# Patient Record
Sex: Female | Born: 1995 | Race: White | Hispanic: No | Marital: Single | Smoking: Never smoker
Health system: Southern US, Community
[De-identification: ages and names within clinical notes are randomized; demographics above are authoritative.]

---

## 2018-11-09 ENCOUNTER — Emergency Department
Admission: EM | Admit: 2018-11-09 | Discharge: 2018-11-09 | Disposition: A | Discharge status: Home / Self Care | Payer: Self-pay | Attending: Student in an Organized Health Care Education/Training Program | Admitting: Student in an Organized Health Care Education/Training Program

## 2018-11-09 ENCOUNTER — Emergency Department: Payer: Self-pay

## 2018-11-09 ENCOUNTER — Encounter: Payer: Self-pay | Admitting: Intensive Care

## 2018-11-09 ENCOUNTER — Other Ambulatory Visit: Payer: Self-pay

## 2018-11-09 DIAGNOSIS — Y92838 Other recreation area as the place of occurrence of the external cause: Secondary | ICD-10-CM | POA: Insufficient documentation

## 2018-11-09 DIAGNOSIS — J029 Acute pharyngitis, unspecified: Secondary | ICD-10-CM | POA: Insufficient documentation

## 2018-11-09 DIAGNOSIS — R110 Nausea: Secondary | ICD-10-CM | POA: Insufficient documentation

## 2018-11-09 DIAGNOSIS — Y9344 Activity, trampolining: Secondary | ICD-10-CM | POA: Insufficient documentation

## 2018-11-09 DIAGNOSIS — S0003XA Contusion of scalp, initial encounter: Secondary | ICD-10-CM | POA: Insufficient documentation

## 2018-11-09 DIAGNOSIS — Y998 Other external cause status: Secondary | ICD-10-CM | POA: Insufficient documentation

## 2018-11-09 DIAGNOSIS — W228XXA Striking against or struck by other objects, initial encounter: Secondary | ICD-10-CM | POA: Insufficient documentation

## 2018-11-09 LAB — INFLUENZA PANEL BY PCR (TYPE A & B)
Influenza A By PCR: NEGATIVE
Influenza B By PCR: NEGATIVE

## 2018-11-09 LAB — GROUP A STREP BY PCR: Group A Strep by PCR: NOT DETECTED

## 2018-11-09 MED ORDER — LIDOCAINE VISCOUS HCL 2 % MT SOLN: 5.0000 mL | Freq: Four times a day (QID) | OROMUCOSAL | 0 refills | Status: AC | PRN

## 2018-11-09 MED ORDER — PROMETHAZINE-DM 6.25-15 MG/5ML PO SYRP: 5.0000 mL | ORAL_SOLUTION | Freq: Four times a day (QID) | ORAL | 0 refills | Status: AC | PRN

## 2018-11-09 NOTE — ED Notes (Signed)
First Nurse Note: Patient states she hurt her head a week ago doing a "back flip", also complaining of nausea since that time.  Alert and oriented.  Indicates back of head as source of pain.

## 2018-11-09 NOTE — Discharge Instructions (Signed)
Your CT was unremarkable.  Your strep and flu test were negative.  Sore throat has a viral etiology.  Follow discharge care instruction take medication as directed.

## 2018-11-09 NOTE — ED Triage Notes (Signed)
Patient c/o sore throat X2 days with nausea, chills/sweats, decreased food intake. Denies urinary symptoms. HX strep. Mom also reports patient was on trampoline Christmas eve and hit head the wrong way and ever since has had slight headache. Pupils reactive and equal in triage. No blurry vision. A&O x4. Ambulatory with no problems

## 2018-11-09 NOTE — ED Provider Notes (Signed)
[facility] Emergency Department Provider Note   ____________________________________________   First MD Initiated Contact with Patient 11/09/18 1018     (approximate)  I have reviewed the triage vital signs and the nursing notes.   HISTORY  Chief Complaint Headache and Sore Throat    HPI Marie Lee is a 22 y.o. female patient complain of sore throat for 2 days.  Patient also complaining of nausea, chills, decreased food intake.  Patient has a history of recurrent strep pharyngitis.  Patient states her family did not take flu shots.  Mother state patient hit the back of her head on a trampoline on Christmas Eve has had continued headaches that is not relieved with Tylenol.  Patient denies blurry vision but states intimating vertigo.  Patient rates her sore throat as 8/10.  Patient described the pain is "scratchy".  History reviewed. No pertinent past medical history.  There are no active problems to display for this patient.   History reviewed. No pertinent surgical history.  Prior to Admission medications   Medication Sig Start Date End Date Taking? Authorizing Provider  lidocaine (XYLOCAINE) 2 % solution Use as directed 5 mLs in the mouth or throat every 6 (six) hours as needed for mouth pain. Mix with 5 mL of Phenergan DM for swish and swallow. 11/09/18   Joni ReiningSmith, Ronald K, PA-C  lidocaine (XYLOCAINE) 2 % solution Use as directed 5 mLs in the mouth or throat every 6 (six) hours as needed for mouth pain. Mix with Phenergan DM for swish and swallow 11/09/18   Joni ReiningSmith, Ronald K, PA-C  promethazine-dextromethorphan (PROMETHAZINE-DM) 6.25-15 MG/5ML syrup Take 5 mLs by mouth 4 (four) times daily as needed for cough. Mix with 5 mL of viscous lidocaine for swish and swallow. 11/09/18   Joni ReiningSmith, Ronald K, PA-C    Allergies Patient has no known allergies.  History reviewed. No pertinent family history.  Social History Social History   Tobacco Use  .  Smoking status: Never Smoker  . Smokeless tobacco: Never Used  Substance Use Topics  . Alcohol use: Yes  . Drug use: Never    Review of Systems Constitutional: Chills/sweats.  Decreased appetite secondary to sore throat. Eyes: No visual changes. ENT: Sore throat. Cardiovascular: Denies chest pain. Respiratory: Denies shortness of breath. Gastrointestinal: No abdominal pain.  No nausea, no vomiting.  No diarrhea.  No constipation. Genitourinary: Negative for dysuria. Musculoskeletal: Negative for back pain. Skin: Negative for rash. Neurological: Positive for headaches, but denies focal weakness or numbness.  Intermittent slight vertigo.   ____________________________________________   PHYSICAL EXAM:  VITAL SIGNS: ED Triage Vitals [11/09/18 0959]  Enc Vitals Group     BP 113/71     Pulse Rate (!) 109     Resp 16     Temp 99.3 F (37.4 C)     Temp Source Oral     SpO2 97 %     Weight 130 lb (59 kg)     Height 5\' 3"  (1.6 m)     Head Circumference      Peak Flow      Pain Score 8     Pain Loc      Pain Edu?      Excl. in GC?    Constitutional: Alert and oriented. Well appearing and in no acute distress. Eyes: Conjunctivae are normal. PERRL. EOMI. Head: Atraumatic.  Tender to palpation occipital area. Nose: No congestion/rhinnorhea. Mouth/Throat: Mucous membranes are moist.  Oropharynx erythematous. Neck: No stridor.  Hematological/Lymphatic/Immunilogical: No cervical lymphadenopathy. Cardiovascular: Normal rate, regular rhythm. Grossly normal heart sounds.  Good peripheral circulation. Respiratory: Normal respiratory effort.  No retractions. Lungs CTAB. Neurologic:  Normal speech and language. No gross focal neurologic deficits are appreciated. No gait instability. Skin:  Skin is warm, dry and intact. No rash noted. Psychiatric: Mood and affect are normal. Speech and behavior are normal.  ____________________________________________   LABS (all labs ordered are  listed, but only abnormal results are displayed)  Labs Reviewed  GROUP A STREP BY PCR  INFLUENZA PANEL BY PCR (TYPE A & B)   ____________________________________________  EKG   ____________________________________________  RADIOLOGY  ED MD interpretation:    Official radiology report(s): Ct Head Wo Contrast  Result Date: 11/09/2018 CLINICAL DATA:  22 year old female with history of occipital injury during a back flip on 11/03/2018. Continued headaches and nausea since that time. Blurred vision and photosensitivity. EXAM: CT HEAD WITHOUT CONTRAST TECHNIQUE: Contiguous axial images were obtained from the base of the skull through the vertex without intravenous contrast. COMPARISON:  None. FINDINGS: Brain: No evidence of acute infarction, hemorrhage, hydrocephalus, extra-axial collection or mass lesion/mass effect. Vascular: No hyperdense vessel or unexpected calcification. Skull: Normal. Negative for fracture or focal lesion. Sinuses/Orbits: No acute finding. Other: None. IMPRESSION: 1. No acute intracranial abnormalities. The appearance of the brain is normal. Electronically Signed   By: Trudie Reedaniel  Entrikin M.D.   On: 11/09/2018 10:56    ____________________________________________   PROCEDURES  Procedure(s) performed: None  Procedures  Critical Care performed: No  ____________________________________________   INITIAL IMPRESSION / ASSESSMENT AND PLAN / ED COURSE  As part of my medical decision making, I reviewed the following data within the electronic MEDICAL RECORD NUMBER    Patient presents with persistent headache secondary to contusion 6 days ago.  Patient also complain of sore throat with body aches intermittent fever and chills.  Discussed negative head CT with patient.  Patient flu and strep test were negative.  Patient diagnosis viral pharyngitis and head contusion.  Patient given discharge care instruction advised take medication as directed.       ____________________________________________   FINAL CLINICAL IMPRESSION(S) / ED DIAGNOSES  Final diagnoses:  Viral pharyngitis  Contusion of scalp, initial encounter     ED Discharge Orders         Ordered    promethazine-dextromethorphan (PROMETHAZINE-DM) 6.25-15 MG/5ML syrup  4 times daily PRN     11/09/18 1144    lidocaine (XYLOCAINE) 2 % solution  Every 6 hours PRN     11/09/18 1144    lidocaine (XYLOCAINE) 2 % solution  Every 6 hours PRN     11/09/18 1147           Note:  This document was prepared using Dragon voice recognition software and may include unintentional dictation errors.    Joni ReiningSmith, Ronald K, PA-C 11/09/18 1518    Willy Eddyobinson, Patrick, MD 11/09/18 [phone]

## 2020-08-19 IMAGING — CT CT HEAD W/O CM
3 series · 16 of 43 positions shown, 19 images · non-contrast
Comparison: None.

CLINICAL DATA: 22-year-old female with history of occipital injury
during a back flip on 11/03/2018. Continued headaches and nausea
since that time. Blurred vision and photosensitivity.

EXAM:
CT HEAD WITHOUT CONTRAST
TECHNIQUE: Contiguous axial images were obtained from the base of the skull
through the vertex without intravenous contrast.

[Series 2: head wo · axial · 0.38mm/px · z∈[-126,-21]mm · 10 of 26 slices shown, 13 images]
[im 3/26  brain]
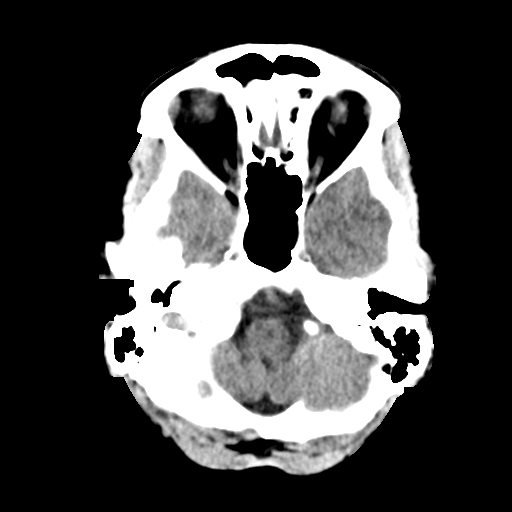
[im 3/26  bone]
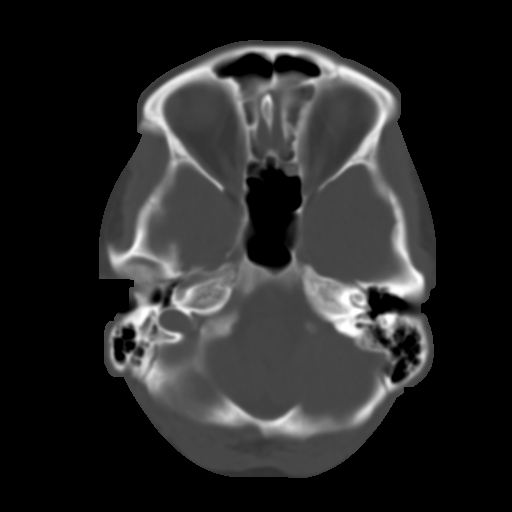
[im 5/26  brain]
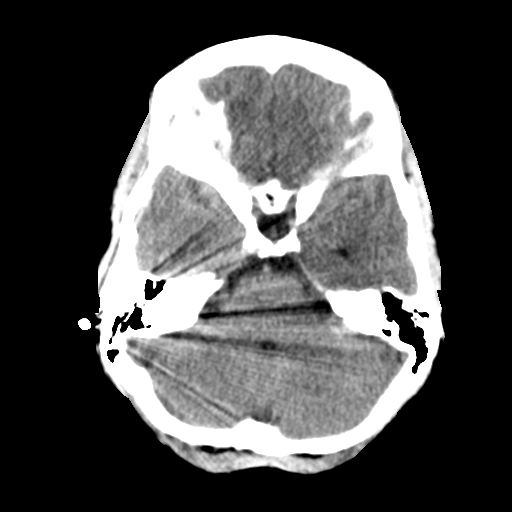
[im 8/26  brain]
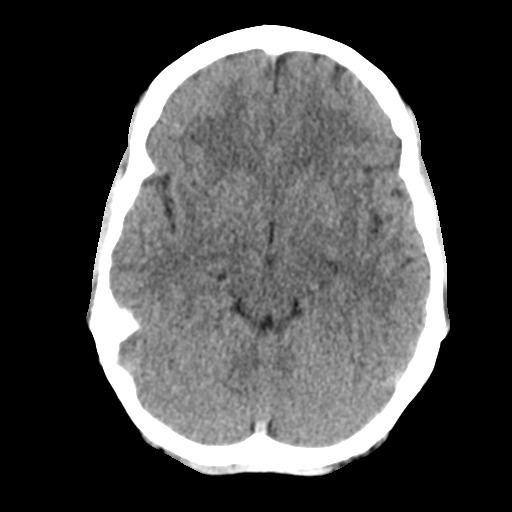
[im 10/26  brain]
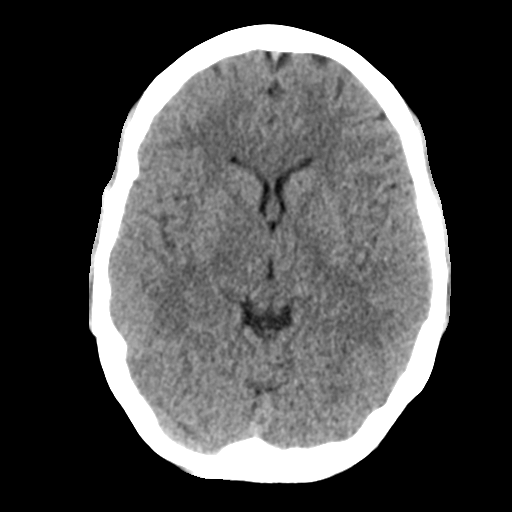
[im 12/26  brain]
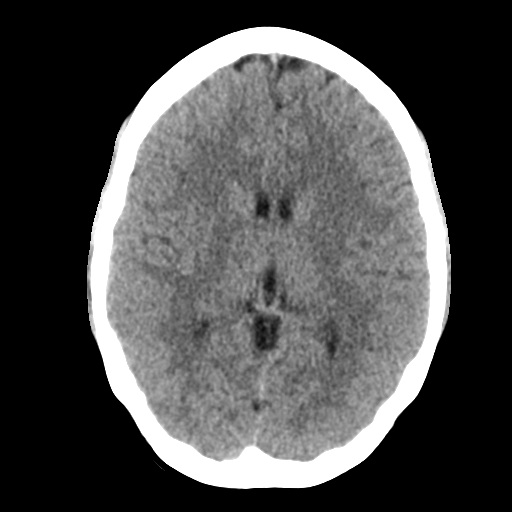
[im 12/26  bone]
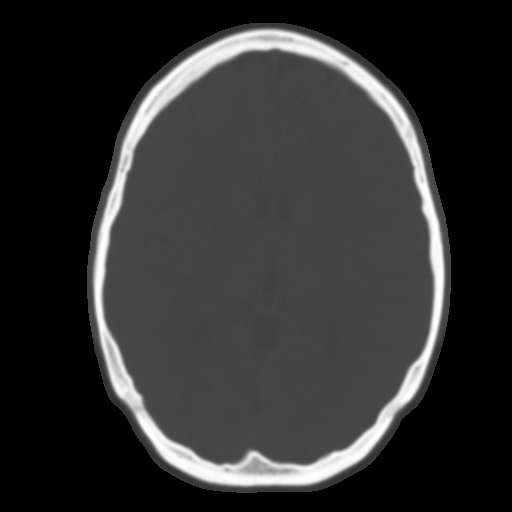
[im 15/26  brain]
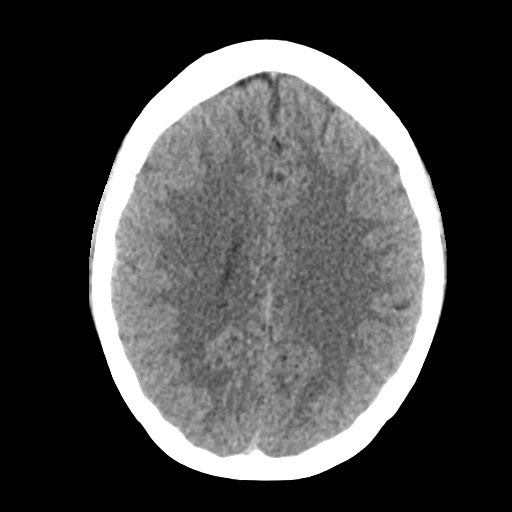
[im 17/26  brain]
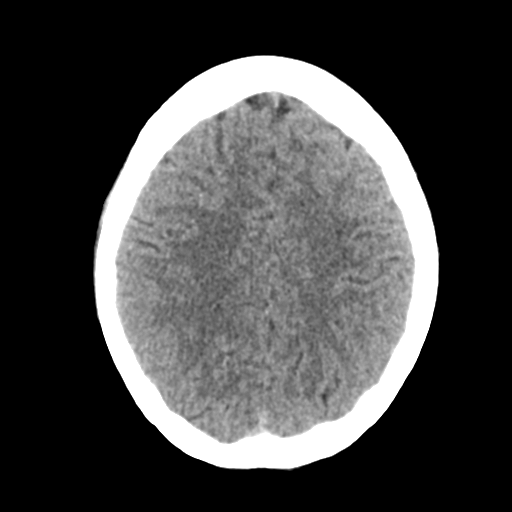
[im 20/26  brain]
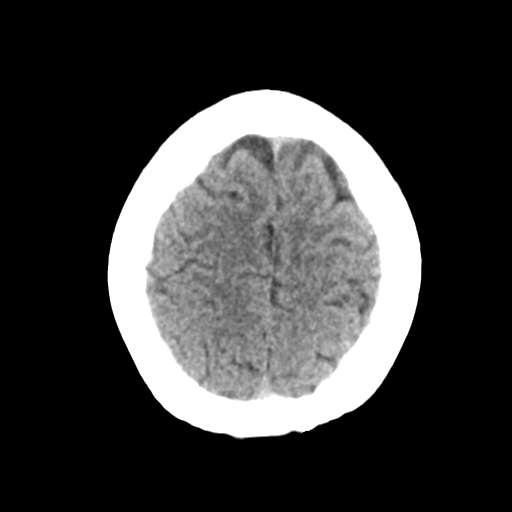
[im 22/26  brain]
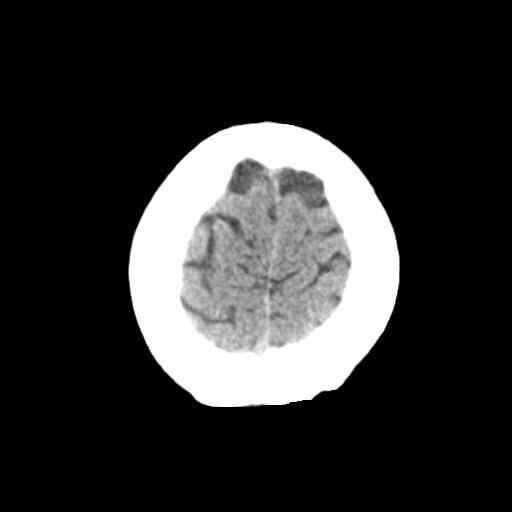
[im 22/26  bone]
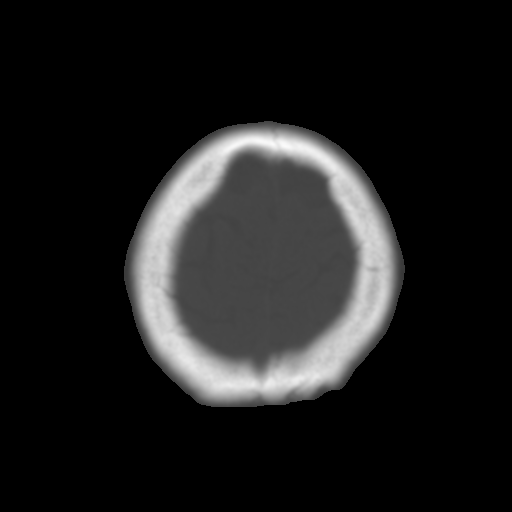
[im 24/26  brain]
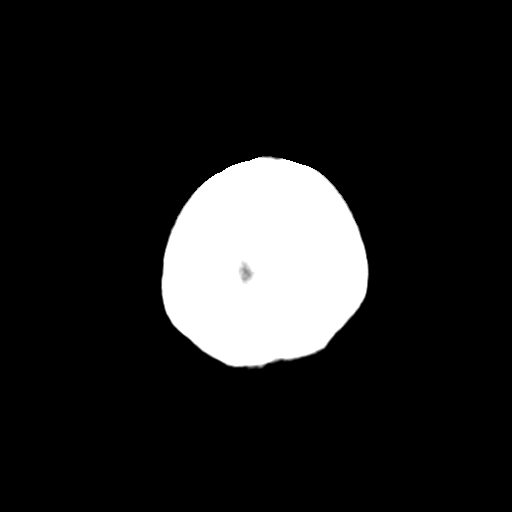

[Series 4: coronal soft tissue · coronal · 0.31mm/px · 3 of 63 slices shown]
[im 21/63  brain]
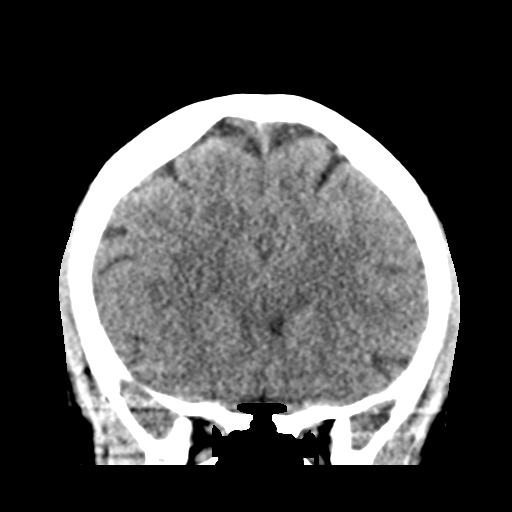
[im 28/63  brain]
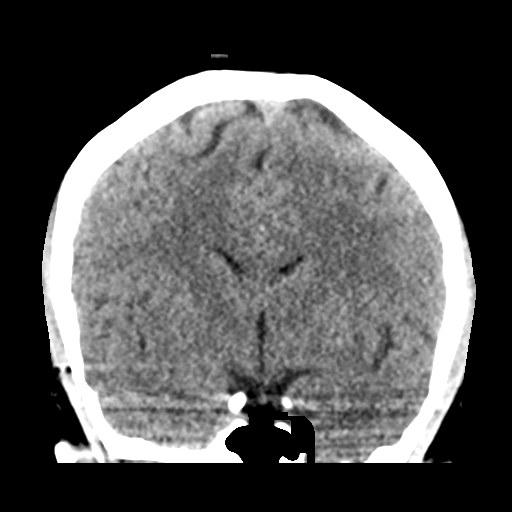
[im 35/63  brain]
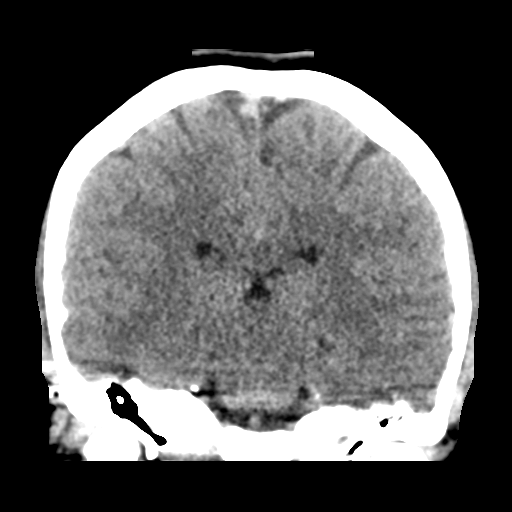

[Series 5: sagittal soft tissue · sagittal · 0.29mm/px · 3 of 50 slices shown]
[im 17/50  brain]
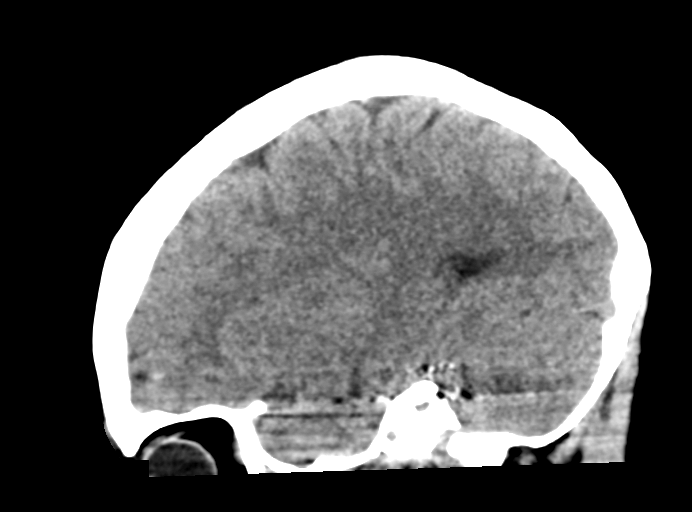
[im 25/50  brain]
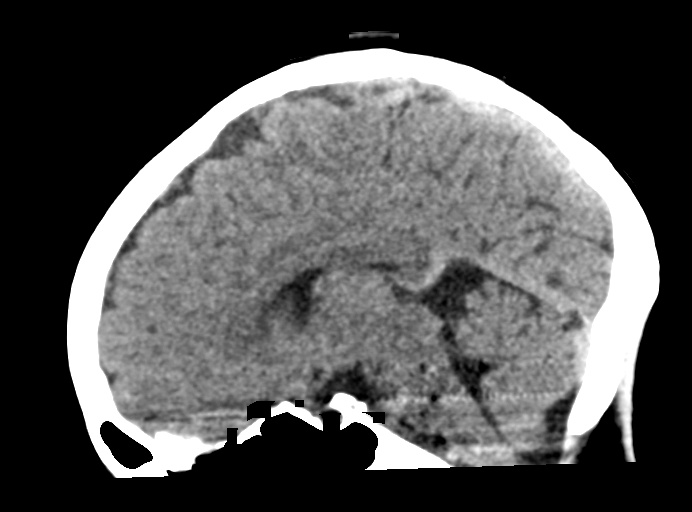
[im 33/50  brain]
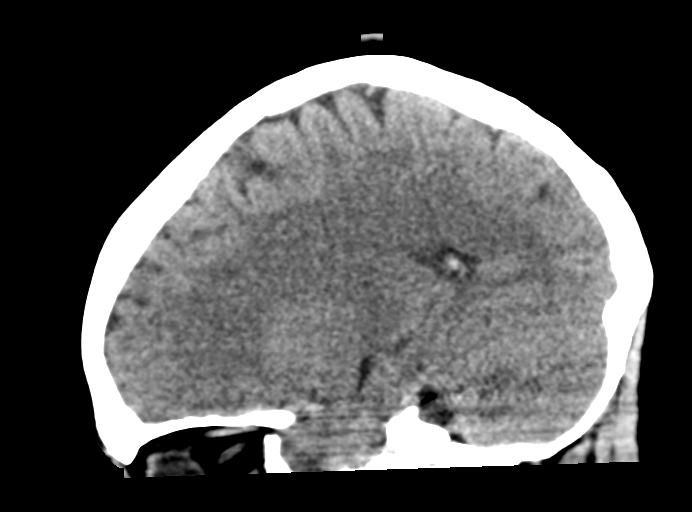

[16 of 43 positions shown; findings below may reference images not displayed]

FINDINGS: Brain: No evidence of acute infarction, hemorrhage, hydrocephalus,
extra-axial collection or mass lesion/mass effect.

Vascular: No hyperdense vessel or unexpected calcification.

Skull: Normal. Negative for fracture or focal lesion.

Sinuses/Orbits: No acute finding.

Other: None.
IMPRESSION: 1. No acute intracranial abnormalities. The appearance of the brain
is normal.
